# Patient Record
Sex: Female | Born: 2005 | Race: White | Hispanic: No | Marital: Single | Smoking: Never smoker
Health system: Southern US, Community
[De-identification: ages and names within clinical notes are randomized; demographics above are authoritative.]

---
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
## 2024-08-08 ENCOUNTER — Ambulatory Visit
Admission: EM | Admit: 2024-08-08 | Discharge: 2024-08-08 | Disposition: A | Discharge status: Home / Self Care | Source: Home / Self Care

## 2024-08-08 ENCOUNTER — Other Ambulatory Visit: Payer: Self-pay

## 2024-08-08 ENCOUNTER — Encounter: Payer: Self-pay | Admitting: Emergency Medicine

## 2024-08-08 DIAGNOSIS — J039 Acute tonsillitis, unspecified: Secondary | ICD-10-CM | POA: Diagnosis not present

## 2024-08-08 LAB — POCT RAPID STREP A (OFFICE): Rapid Strep A Screen: NEGATIVE

## 2024-08-08 MED ORDER — LIDOCAINE VISCOUS HCL 2 % MT SOLN: 10.0000 mL | OROMUCOSAL | 0 refills | Status: AC | PRN

## 2024-08-08 NOTE — ED Provider Notes (Signed)
 " [facility]    CSN: [id number] Arrival date & time: 08/08/24  1743      History   Chief Complaint Chief Complaint  Patient presents with   Sore Throat    HPI Emma Burns is a 19 y.o. female.   Patient presenting today with several day history of sore swollen feeling throat.  Denies fever, chills, body aches, headache, cough, congestion, nausea, vomiting.  So far trying ibuprofen  and Tylenol with minimal relief.  Notes history of recurrent strep infections.    History reviewed. No pertinent past medical history.  There are no active problems to display for this patient.   History reviewed. No pertinent surgical history.  OB History   No obstetric history on file.      Home Medications    Prior to Admission medications  Medication Sig Start Date End Date Taking? Authorizing Provider  lidocaine  (XYLOCAINE ) 2 % solution Use as directed 10 mLs in the mouth or throat every 3 (three) hours as needed. 08/08/24  Yes Stuart Vernell Norris, PA-C  cyclobenzaprine  (FLEXERIL ) 5 MG tablet Take 1 tablet (5 mg total) by mouth 3 (three) times daily as needed for muscle spasms. 06/21/23   Leath-Warren, Etta PARAS, NP  ibuprofen  (ADVIL ) 800 MG tablet Take 1 tablet (800 mg total) by mouth 3 (three) times daily. 06/21/23   Leath-Warren, Etta PARAS, NP    Family History History reviewed. No pertinent family history.  Social History Social History[1]   Allergies   Amoxicillin, Other, and Penicillins   Review of Systems Review of Systems Per HPI  Physical Exam Triage Vital Signs ED Triage Vitals  Encounter Vitals Group     BP 08/08/24 1806 (!) 136/91     Girls Systolic BP Percentile --      Girls Diastolic BP Percentile --      Boys Systolic BP Percentile --      Boys Diastolic BP Percentile --      Pulse Rate 08/08/24 1806 (!) 114     Resp 08/08/24 1806 20     Temp 08/08/24 1806 98.5 F (36.9 C)     Temp Source 08/08/24 1806 Oral     SpO2 08/08/24 1806 98 %      Weight --      Height --      Head Circumference --      Peak Flow --      Pain Score 08/08/24 1803 6     Pain Loc --      Pain Education --      Exclude from Growth Chart --    No data found.  Updated Vital Signs BP (!) 136/91 (BP Location: Right Arm)   Pulse (!) 114   Temp 98.5 F (36.9 C) (Oral)   Resp 20   LMP 07/30/2024   SpO2 98%   Visual Acuity Right Eye Distance:   Left Eye Distance:   Bilateral Distance:    Right Eye Near:   Left Eye Near:    Bilateral Near:     Physical Exam Vitals and nursing note reviewed.  Constitutional:      Appearance: Normal appearance. She is not ill-appearing.  HENT:     Head: Atraumatic.     Nose: Nose normal.     Mouth/Throat:     Mouth: Mucous membranes are moist.     Pharynx: Oropharyngeal exudate and posterior oropharyngeal erythema present.  Eyes:     Extraocular Movements: Extraocular movements intact.  Conjunctiva/sclera: Conjunctivae normal.  Cardiovascular:     Rate and Rhythm: Normal rate.  Pulmonary:     Effort: Pulmonary effort is normal.  Musculoskeletal:        General: Normal range of motion.     Cervical back: Normal range of motion and neck supple.  Lymphadenopathy:     Cervical: Cervical adenopathy present.  Skin:    General: Skin is warm and dry.  Neurological:     Mental Status: She is alert and oriented to person, place, and time.  Psychiatric:        Mood and Affect: Mood normal.        Thought Content: Thought content normal.        Judgment: Judgment normal.      UC Treatments / Results  Labs (all labs ordered are listed, but only abnormal results are displayed) Labs Reviewed  CULTURE, GROUP A STREP [facility])  POCT RAPID STREP A (OFFICE)    EKG   Radiology No results found.  Procedures Procedures (including critical care time)  Medications Ordered in UC Medications - No data to display  Initial Impression / Assessment and Plan / UC Course  I have reviewed the triage  vital signs and the nursing notes.  Pertinent labs & imaging results that were available during my care of the patient were reviewed by me and considered in my medical decision making (see chart for details).     Patient is well-appearing and in no acute distress on exam.  Her rapid strep is negative, throat culture is pending.  She notes that she has many antibiotic allergies and has had hives with every single 1 she has tried in the last few years.  She does not recall any specifically apart from penicillins unfortunately.  She is not sure if she has ever been on clindamycin  when asked, discussed that we could try this cautiously if her throat culture comes back positive.  Otherwise treat with viscous lidocaine , over-the-counter pain relievers, supportive home care.  Close PCP and/or ENT follow-up recommended.  Final Clinical Impressions(s) / UC Diagnoses   Final diagnoses:  Acute tonsillitis, unspecified etiology     Discharge Instructions      Your rapid strep test was negative.  We will let you know if your throat culture comes back positive and treat accordingly.  I have sent in a numbing liquid to help with pain in addition ibuprofen  and Tylenol.  Make sure to stay well-hydrated.  I do recommend following up with either an ear nose and throat specialist or your primary care provider for a recheck given the recurrence of this issue.    ED Prescriptions     Medication Sig Dispense Auth. Provider   lidocaine  (XYLOCAINE ) 2 % solution Use as directed 10 mLs in the mouth or throat every 3 (three) hours as needed. 100 mL Stuart Vernell Norris, [place]      PDMP not reviewed this encounter.    [1]  Social History Tobacco Use   Smoking status: Never   Smokeless tobacco: Never  Substance Use Topics   Alcohol use: Never   Drug use: Never     Stuart Vernell Norris, PA-C 08/08/24 1834  "

## 2024-08-11 ENCOUNTER — Ambulatory Visit (HOSPITAL_COMMUNITY): Payer: Self-pay

## 2024-08-11 LAB — CULTURE, GROUP A STREP (THRC)

## 2024-08-11 MED ORDER — CLINDAMYCIN HCL 300 MG PO CAPS: 300.0000 mg | ORAL_CAPSULE | Freq: Three times a day (TID) | ORAL | 0 refills | Status: AC
